# Patient Record
Sex: Male | Born: 1975 | Race: White | Hispanic: No | Marital: Married | Smoking: Never smoker
Health system: Southern US, Community
[De-identification: ages and names within clinical notes are randomized; demographics above are authoritative.]

---

## 2016-10-05 ENCOUNTER — Emergency Department (HOSPITAL_COMMUNITY)
Admission: EM | Admit: 2016-10-05 | Discharge: 2016-10-05 | Disposition: A | Discharge status: Home / Self Care | Payer: 59 | Attending: Emergency Medicine | Admitting: Emergency Medicine

## 2016-10-05 ENCOUNTER — Encounter (HOSPITAL_COMMUNITY): Payer: Self-pay

## 2016-10-05 ENCOUNTER — Emergency Department (HOSPITAL_COMMUNITY): Payer: 59

## 2016-10-05 DIAGNOSIS — N200 Calculus of kidney: Secondary | ICD-10-CM

## 2016-10-05 DIAGNOSIS — R109 Unspecified abdominal pain: Secondary | ICD-10-CM | POA: Diagnosis present

## 2016-10-05 LAB — CBC WITH DIFFERENTIAL/PLATELET
BASOS ABS: 0 10*3/uL (ref 0.0–0.1)
BASOS PCT: 0 %
EOS ABS: 0 10*3/uL (ref 0.0–0.7)
Eosinophils Relative: 0 %
HCT: 43.1 % (ref 39.0–52.0)
HEMOGLOBIN: 14.5 g/dL (ref 13.0–17.0)
Lymphocytes Relative: 9 %
Lymphs Abs: 1.5 10*3/uL (ref 0.7–4.0)
MCH: 29.5 pg (ref 26.0–34.0)
MCHC: 33.6 g/dL (ref 30.0–36.0)
MCV: 87.8 fL (ref 78.0–100.0)
MONO ABS: 0.8 10*3/uL (ref 0.1–1.0)
MONOS PCT: 5 %
NEUTROS ABS: 14.7 10*3/uL — AB (ref 1.7–7.7)
NEUTROS PCT: 86 %
Platelets: 236 10*3/uL (ref 150–400)
RBC: 4.91 MIL/uL (ref 4.22–5.81)
RDW: 12.6 % (ref 11.5–15.5)
WBC: 17 10*3/uL — ABNORMAL HIGH (ref 4.0–10.5)

## 2016-10-05 LAB — URINALYSIS, ROUTINE W REFLEX MICROSCOPIC
BACTERIA UA: NONE SEEN
BILIRUBIN URINE: NEGATIVE
Glucose, UA: NEGATIVE mg/dL
KETONES UR: 20 mg/dL — AB
LEUKOCYTES UA: NEGATIVE
Nitrite: NEGATIVE
PH: 5 (ref 5.0–8.0)
PROTEIN: NEGATIVE mg/dL
SQUAMOUS EPITHELIAL / LPF: NONE SEEN
Specific Gravity, Urine: 1.024 (ref 1.005–1.030)

## 2016-10-05 LAB — BASIC METABOLIC PANEL
ANION GAP: 9 (ref 5–15)
BUN: 9 mg/dL (ref 6–20)
CALCIUM: 8.9 mg/dL (ref 8.9–10.3)
CO2: 25 mmol/L (ref 22–32)
CREATININE: 1.19 mg/dL (ref 0.61–1.24)
Chloride: 101 mmol/L (ref 101–111)
Glucose, Bld: 115 mg/dL — ABNORMAL HIGH (ref 65–99)
Potassium: 3.7 mmol/L (ref 3.5–5.1)
SODIUM: 135 mmol/L (ref 135–145)

## 2016-10-05 MED ORDER — ONDANSETRON HCL 4 MG/2ML IJ SOLN
4.0000 mg | Freq: Once | INTRAMUSCULAR | Status: AC
  Administered 2016-10-05: 4 mg via INTRAVENOUS
  Filled 2016-10-05: qty 2

## 2016-10-05 MED ORDER — ONDANSETRON HCL 4 MG PO TABS: 4.0000 mg | ORAL_TABLET | Freq: Three times a day (TID) | ORAL | 0 refills | Status: AC | PRN

## 2016-10-05 MED ORDER — OXYCODONE-ACETAMINOPHEN 5-325 MG PO TABS: 2.0000 | ORAL_TABLET | Freq: Four times a day (QID) | ORAL | 0 refills | Status: AC | PRN

## 2016-10-05 MED ORDER — KETOROLAC TROMETHAMINE 30 MG/ML IJ SOLN
30.0000 mg | Freq: Once | INTRAMUSCULAR | Status: AC
  Administered 2016-10-05: 30 mg via INTRAVENOUS
  Filled 2016-10-05: qty 1

## 2016-10-05 MED ORDER — SODIUM CHLORIDE 0.9 % IV BOLUS (SEPSIS): 1000.0000 mL | Freq: Once | INTRAVENOUS | Status: DC

## 2016-10-05 MED ORDER — OXYCODONE-ACETAMINOPHEN 5-325 MG PO TABS
1.0000 | ORAL_TABLET | Freq: Once | ORAL | Status: AC
  Administered 2016-10-05: 1 via ORAL
  Filled 2016-10-05: qty 1

## 2016-10-05 MED ORDER — TAMSULOSIN HCL 0.4 MG PO CAPS: 0.4000 mg | ORAL_CAPSULE | Freq: Every day | ORAL | 0 refills | Status: AC

## 2016-10-05 MED ORDER — FENTANYL CITRATE (PF) 100 MCG/2ML IJ SOLN
100.0000 ug | INTRAMUSCULAR | Status: DC | PRN
  Administered 2016-10-05 (×2): 100 ug via INTRAVENOUS
  Filled 2016-10-05 (×2): qty 2

## 2016-10-05 MED ORDER — OXYCODONE-ACETAMINOPHEN 5-325 MG PO TABS
ORAL_TABLET | ORAL | Status: AC
  Filled 2016-10-05: qty 1

## 2016-10-05 MED ORDER — OXYCODONE-ACETAMINOPHEN 5-325 MG PO TABS
1.0000 | ORAL_TABLET | Freq: Once | ORAL | Status: AC
  Administered 2016-10-05: 1 via ORAL

## 2016-10-05 MED ORDER — SODIUM CHLORIDE 0.9 % IV BOLUS (SEPSIS)
1000.0000 mL | Freq: Once | INTRAVENOUS | Status: AC
  Administered 2016-10-05: 1000 mL via INTRAVENOUS

## 2016-10-05 NOTE — ED Notes (Signed)
Pt ambulated to the restroom.

## 2016-10-05 NOTE — ED Triage Notes (Signed)
Pt complaining of R flank pain. Pt states hx of kidney stones. Pt states feels same. Pt states some difficulty urinating. Pt denies any burning/itching with urination.

## 2016-10-05 NOTE — ED Provider Notes (Signed)
[facility]-EMERGENCY DEPT Provider Note   CSN: [id number] Arrival date & time: 10/05/16  1952     History   Chief Complaint Chief Complaint  Patient presents with  . Flank Pain    HPI David Caldwell is a 41 y.o. male.  Patient reports he has a history of kidney stones. Feels like this is similar to prior kidney stones. Started earlier this morning. Right-sided flank pain. Colicky in nature. Getting progressively worse. Endorses nausea, vomiting. No fevers or chills. No urinary symptoms. No abdominal pain, diarrhea. No pain in his penis or his testicles.   The history is provided by the patient.  Illness  This is a new problem. The current episode started 6 to 12 hours ago. The problem occurs constantly. The problem has been gradually worsening. Pertinent negatives include no chest pain, no abdominal pain and no shortness of breath. The symptoms are relieved by narcotics.    History reviewed. No pertinent past medical history.  There are no active problems to display for this patient.   History reviewed. No pertinent surgical history.     Home Medications    Prior to Admission medications   Medication Sig Start Date End Date Taking? Authorizing Provider  ibuprofen (ADVIL,MOTRIN) 200 MG tablet Take 200-400 mg by mouth every 6 (six) hours as needed (for headaches).   Yes [provider]  ondansetron (ZOFRAN) 4 MG tablet Take 1 tablet (4 mg total) by mouth every 8 (eight) hours as needed for nausea or vomiting. 10/05/16   Lindalou Hose'Rourke, Bayli Quesinberry, MD  oxyCODONE-acetaminophen (PERCOCET/ROXICET) 5-325 MG tablet Take 2 tablets by mouth every 6 (six) hours as needed for severe pain. 10/05/16   Lindalou Hose'Rourke, Rayne Cowdrey, MD  tamsulosin (FLOMAX) 0.4 MG CAPS capsule Take 1 capsule (0.4 mg total) by mouth daily. 10/05/16   Lindalou Hose'Rourke, Ramirez Fullbright, MD    Family History History reviewed. No pertinent family history.  Social History Social History  Substance Use Topics  . Smoking status: Never Smoker  .  Smokeless tobacco: Never Used  . Alcohol use No     Allergies   Morphine and related   Review of Systems Review of Systems  Constitutional: Negative for chills and fever.  Respiratory: Negative for shortness of breath.   Cardiovascular: Negative for chest pain.  Gastrointestinal: Positive for nausea and vomiting. Negative for abdominal pain, blood in stool and diarrhea.  Genitourinary: Positive for flank pain. Negative for dysuria, frequency, penile pain and testicular pain.  All other systems reviewed and are negative.    Physical Exam Updated Vital Signs BP (!) 148/91   Pulse 86   Temp 98 F (36.7 C) (Oral)   Resp 18   SpO2 97%   Physical Exam  Constitutional: He appears well-developed and well-nourished.  Non-toxic appearance. He appears distressed.  HENT:  Head: Normocephalic and atraumatic.  Eyes: Conjunctivae are normal.  Neck: Neck supple.  Cardiovascular: Normal rate and regular rhythm.   No murmur heard. Pulmonary/Chest: Effort normal and breath sounds normal. No respiratory distress.  Abdominal: Soft. There is no tenderness. There is no rebound, no guarding, no tenderness at McBurney's point and negative Murphy's sign. Hernia confirmed negative in the right inguinal area and confirmed negative in the left inguinal area.  Genitourinary: Penis normal. Cremasteric reflex is present. Right testis shows no tenderness. Left testis shows no tenderness.  Musculoskeletal: He exhibits no edema.  Neurological: He is alert.  Skin: Skin is warm and dry.  Psychiatric: He has a normal mood and affect.  Nursing note and  vitals reviewed.    ED Treatments / Results  Labs (all labs ordered are listed, but only abnormal results are displayed) Labs Reviewed  URINALYSIS, ROUTINE W REFLEX MICROSCOPIC - Abnormal; Notable for the following:       Result Value   Hgb urine dipstick MODERATE (*)    Ketones, ur 20 (*)    All other components within normal limits  BASIC METABOLIC  PANEL - Abnormal; Notable for the following:    Glucose, Bld 115 (*)    All other components within normal limits  CBC WITH DIFFERENTIAL/PLATELET - Abnormal; Notable for the following:    WBC 17.0 (*)    Neutro Abs 14.7 (*)    All other components within normal limits    EKG  EKG Interpretation None       Radiology US Renal  Result Date: 10/05/2016 CLINICAL DATA:  Right flank pain for 5 hours. Previous history of renal stones. EXAM: RENAL / URINARY TRACT ULTRASOUND COMPLETE COMPARISON:  None. FINDINGS: Right Kidney: Length: 12 cm. Mild prominence of the right intrarenal collecting system. No echogenic stones identified. No mass lesion. Left Kidney: Length: 10.8 cm. Echogenicity within normal limits. No mass or hydronephrosis visualized. Bladder: Appears normal for degree of bladder distention. IMPRESSION: Mildly dilated intrarenal collecting system on the right. Otherwise normal examination. Electronically Signed   By: Burman Nieves M.D.   On: 10/05/2016 22:15    Procedures Procedures (including critical care time)  Medications Ordered in ED Medications  fentaNYL (SUBLIMAZE) injection 100 mcg (100 mcg Intravenous Given 10/05/16 2310)  oxyCODONE-acetaminophen (PERCOCET/ROXICET) 5-325 MG per tablet 1 tablet (1 tablet Oral Given 10/05/16 2016)  sodium chloride 0.9 % bolus 1,000 mL (1,000 mLs Intravenous New Bag/Given 10/05/16 2111)  ondansetron (ZOFRAN) injection 4 mg (4 mg Intravenous Given 10/05/16 2126)  ketorolac (TORADOL) 30 MG/ML injection 30 mg (30 mg Intravenous Given 10/05/16 2258)  oxyCODONE-acetaminophen (PERCOCET/ROXICET) 5-325 MG per tablet 1 tablet (1 tablet Oral Given 10/05/16 2258)     Initial Impression / Assessment and Plan / ED Course  I have reviewed the triage vital signs and the nursing notes.  Pertinent labs & imaging results that were available during my care of the patient were reviewed by me and considered in my medical decision making (see chart for  details).     Patient does appear uncomfortable, but nontoxic appearing. Vital signs are normal and stable. Abdomen is benign. No peritoneal signs. doubt acute abdomen. GU exam normal. Testicles are not high riding, not horizontally lying. Cremasteric reflex intact. No tenderness to palpation on GU exam. Doubt torsion. Urinalysis does show large blood. Given the fact that the patient is had multiple stones in the past and this feels like prior stone, got a renal ultrasound instead of a CT stone study. Evidence of mild hydronephrosis. Given only mild Hydro, patient likely will be able to pass stone on his own. Have given the patient IV fluids, antiemetics, pain medications here. We'll give him prescriptions for pain medications, antiemetics and Flomax. Patient reports that he has a urologist already. Told him to call them as soon as possible as well as his primary care doctor. Urinalysis without evidence of UTI to indicate an infected stone. Electro lites within normal limits. Normal kidney function. Told him to return to the emergency department should he have intractable nausea, vomiting, or any worsening pain that is concerning.  Final Clinical Impressions(s) / ED Diagnoses   Final diagnoses:  Kidney stone    New Prescriptions New Prescriptions  ONDANSETRON (ZOFRAN) 4 MG TABLET    Take 1 tablet (4 mg total) by mouth every 8 (eight) hours as needed for nausea or vomiting.   OXYCODONE-ACETAMINOPHEN (PERCOCET/ROXICET) 5-325 MG TABLET    Take 2 tablets by mouth every 6 (six) hours as needed for severe pain.   TAMSULOSIN (FLOMAX) 0.4 MG CAPS CAPSULE    Take 1 capsule (0.4 mg total) by mouth daily.     Lindalou Hose, MD 10/05/16 Adin Hector    Cathren Laine, MD 10/05/16 [phone]

## 2016-10-05 NOTE — ED Notes (Signed)
Patient transported to Ultrasound 

## 2018-05-22 IMAGING — US US RENAL
1 series · 14 of 25 positions shown · non-contrast
Comparison: None.

CLINICAL DATA: Right flank pain for 5 hours. Previous history of
renal stones.

EXAM:
RENAL / URINARY TRACT ULTRASOUND COMPLETE

[Series 1: us renal · 0.25mm/px · 14 of 37 slices shown]
[im 1/37]
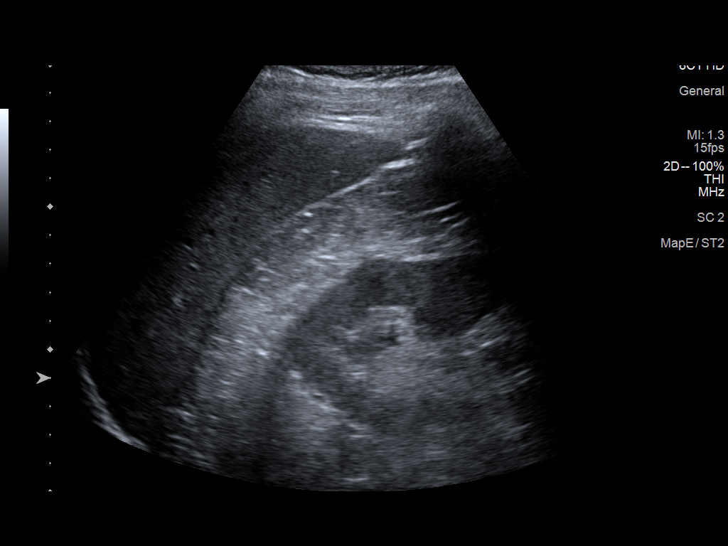
[im 4/37]
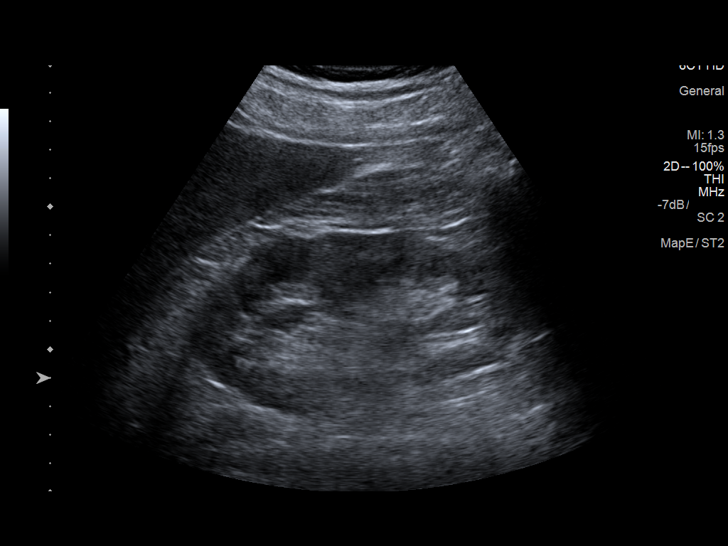
[im 7/37]
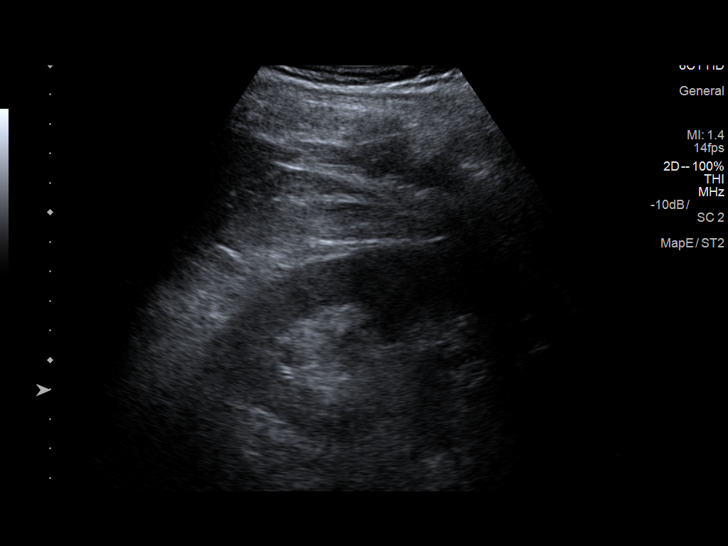
[im 10/37]
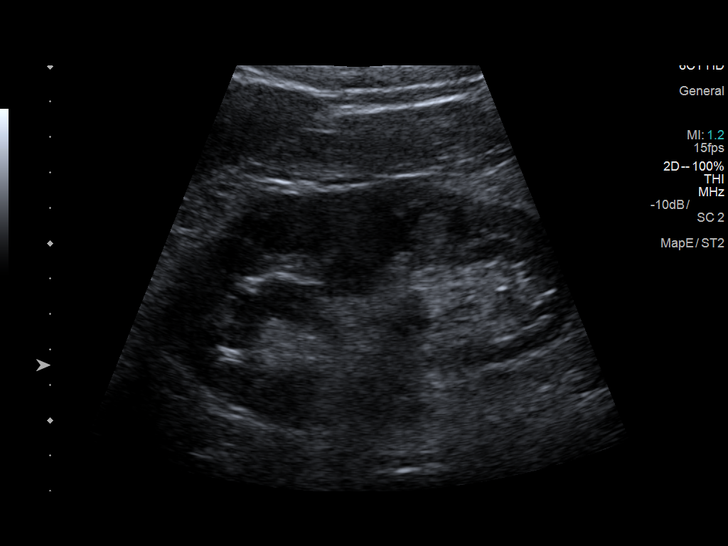
[im 13/37]
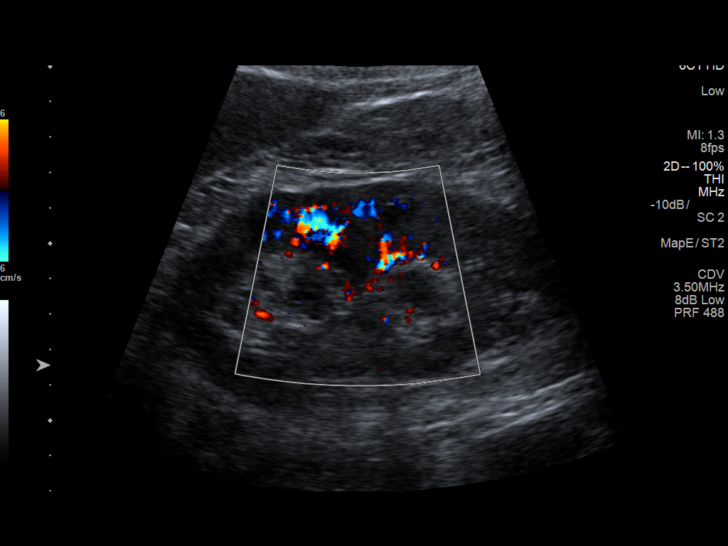
[im 14/37]
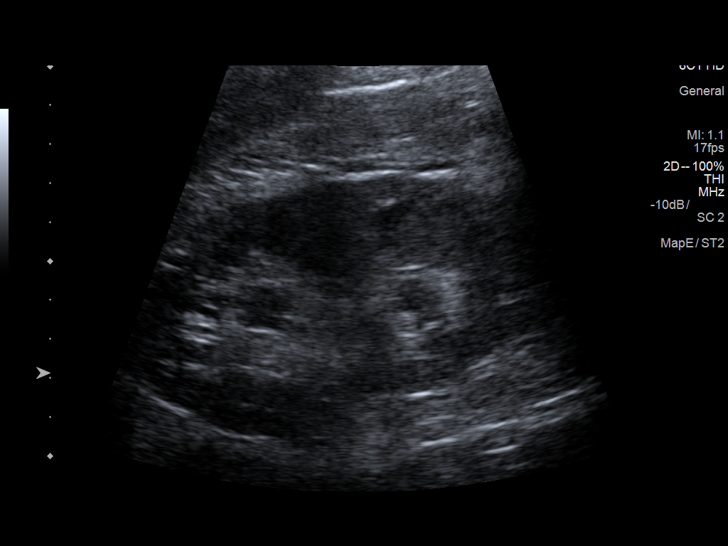
[im 17/37]
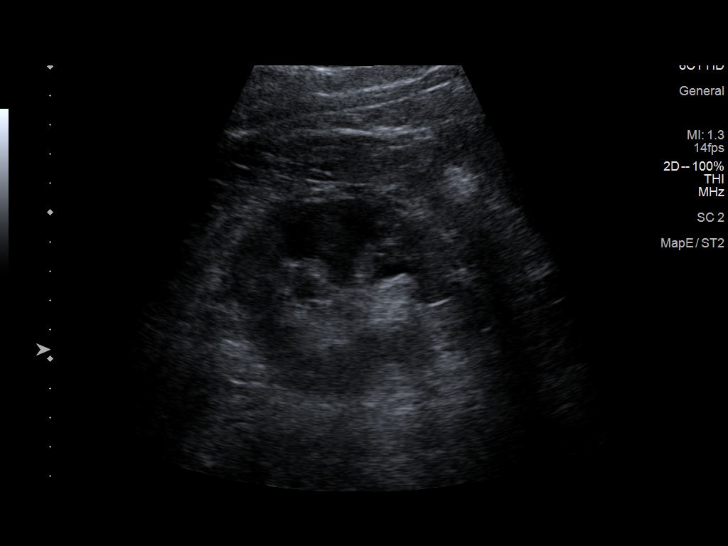
[im 20/37]
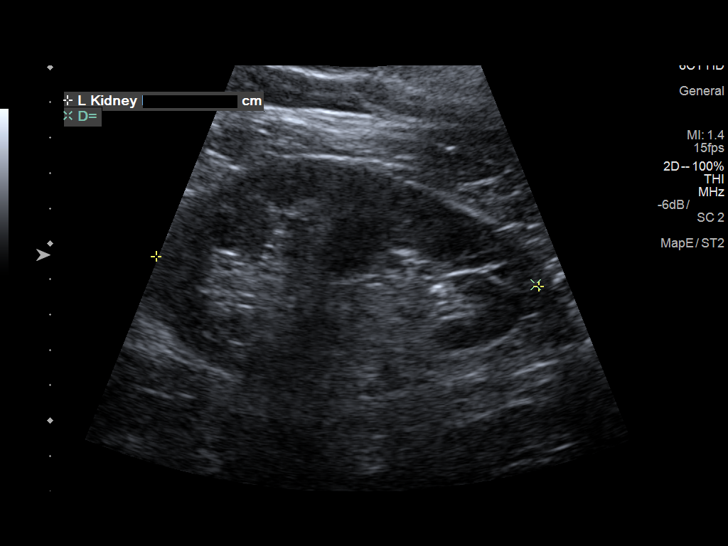
[im 23/37]
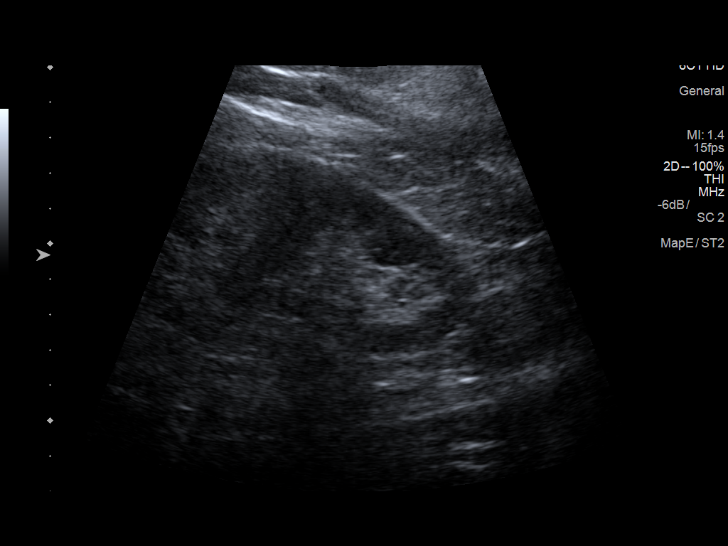
[im 25/37]
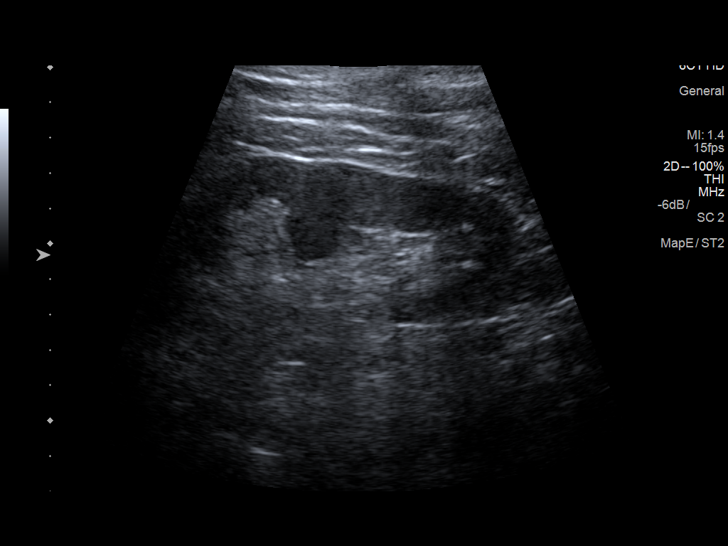
[im 28/37]
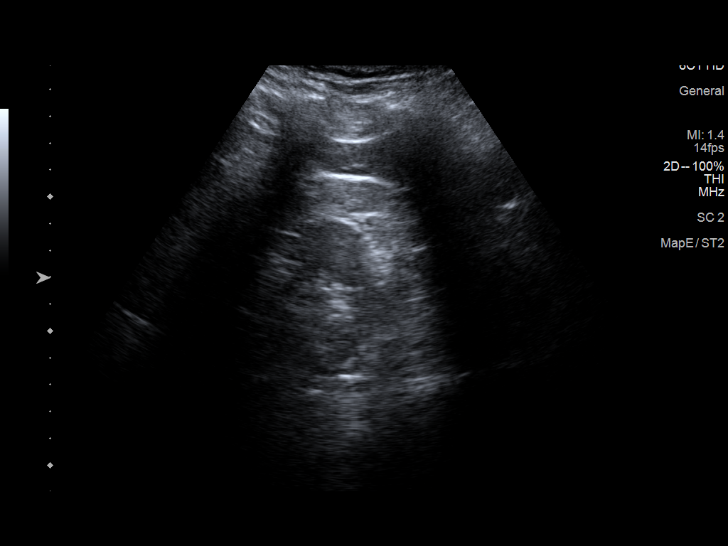
[im 31/37]
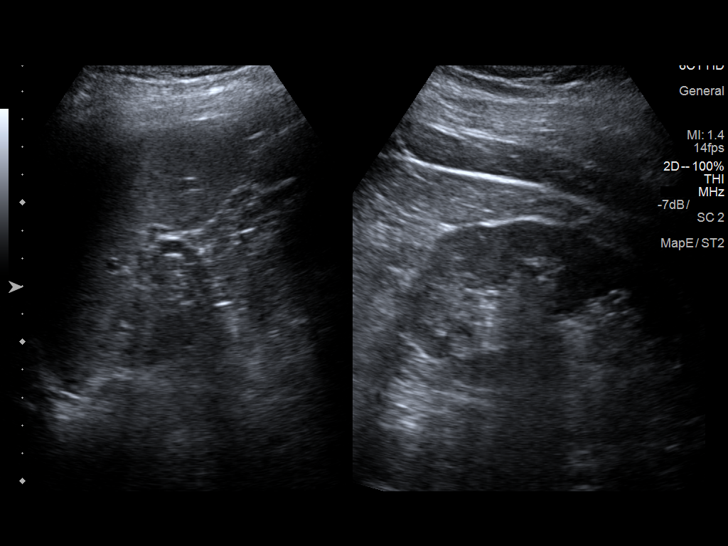
[im 34/37]
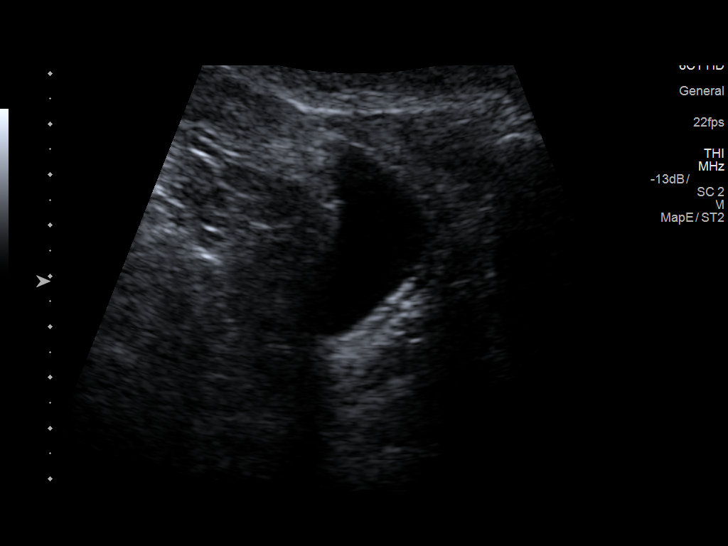
[im 37/37]
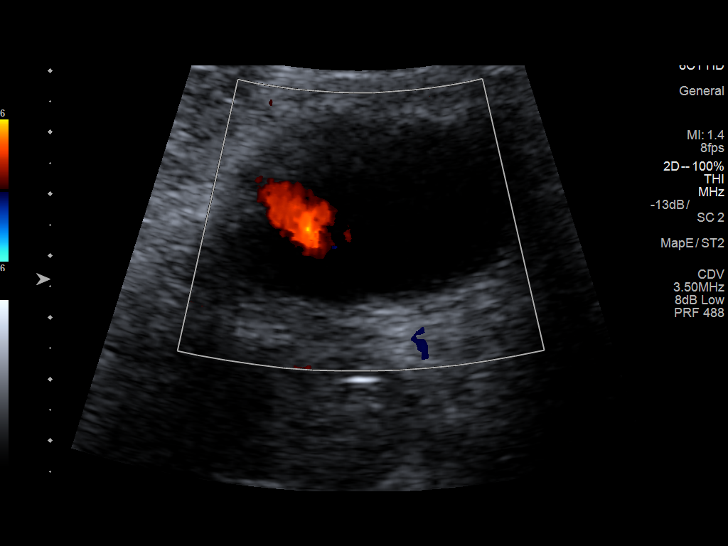

[14 of 25 positions shown; findings below may reference images not displayed]

FINDINGS: Right Kidney:

Length: 12 cm. Mild prominence of the right intrarenal collecting
system. No echogenic stones identified. No mass lesion.

Left Kidney:

Length: 10.8 cm. Echogenicity within normal limits. No mass or
hydronephrosis visualized.

Bladder:

Appears normal for degree of bladder distention.
IMPRESSION: Mildly dilated intrarenal collecting system on the right. Otherwise
normal examination.
